# Patient Record
Sex: Female | Born: 1980 | Race: White | Hispanic: No | Smoking: Former smoker
Health system: Southern US, Community
[De-identification: ages and names within clinical notes are randomized; demographics above are authoritative.]

## PROBLEM LIST (undated history)

## (undated) DIAGNOSIS — N200 Calculus of kidney: Secondary | ICD-10-CM

## (undated) DIAGNOSIS — E0590 Thyrotoxicosis, unspecified without thyrotoxic crisis or storm: Secondary | ICD-10-CM

## (undated) DIAGNOSIS — E039 Hypothyroidism, unspecified: Secondary | ICD-10-CM

## (undated) DIAGNOSIS — F4310 Post-traumatic stress disorder, unspecified: Secondary | ICD-10-CM

## (undated) DIAGNOSIS — F4001 Agoraphobia with panic disorder: Secondary | ICD-10-CM

## (undated) DIAGNOSIS — E119 Type 2 diabetes mellitus without complications: Secondary | ICD-10-CM

## (undated) DIAGNOSIS — F411 Generalized anxiety disorder: Secondary | ICD-10-CM

## (undated) DIAGNOSIS — D497 Neoplasm of unspecified behavior of endocrine glands and other parts of nervous system: Secondary | ICD-10-CM

## (undated) HISTORY — PX: LITHOTRIPSY: SUR834

---

## 2009-05-07 ENCOUNTER — Ambulatory Visit: Payer: Self-pay | Admitting: Diagnostic Radiology

## 2009-05-07 ENCOUNTER — Emergency Department (HOSPITAL_BASED_OUTPATIENT_CLINIC_OR_DEPARTMENT_OTHER): Admission: EM | Admit: 2009-05-07 | Discharge: 2009-05-07 | Payer: Self-pay | Admitting: Emergency Medicine

## 2009-05-15 ENCOUNTER — Emergency Department (HOSPITAL_BASED_OUTPATIENT_CLINIC_OR_DEPARTMENT_OTHER): Admission: EM | Admit: 2009-05-15 | Discharge: 2009-05-15 | Payer: Self-pay | Admitting: Emergency Medicine

## 2009-05-15 ENCOUNTER — Ambulatory Visit: Payer: Self-pay | Admitting: Diagnostic Radiology

## 2010-01-10 ENCOUNTER — Emergency Department (HOSPITAL_BASED_OUTPATIENT_CLINIC_OR_DEPARTMENT_OTHER): Admission: EM | Admit: 2010-01-10 | Discharge: 2010-01-10 | Payer: Self-pay | Admitting: Emergency Medicine

## 2010-02-13 ENCOUNTER — Emergency Department (HOSPITAL_BASED_OUTPATIENT_CLINIC_OR_DEPARTMENT_OTHER)
Admission: EM | Admit: 2010-02-13 | Discharge: 2010-02-13 | Payer: Self-pay | Source: Home / Self Care | Admitting: Emergency Medicine

## 2010-03-14 ENCOUNTER — Emergency Department (HOSPITAL_BASED_OUTPATIENT_CLINIC_OR_DEPARTMENT_OTHER)
Admission: EM | Admit: 2010-03-14 | Discharge: 2010-03-15 | Payer: Self-pay | Source: Home / Self Care | Admitting: Emergency Medicine

## 2010-03-20 LAB — CULTURE, ROUTINE-ABSCESS

## 2010-04-17 ENCOUNTER — Emergency Department (HOSPITAL_BASED_OUTPATIENT_CLINIC_OR_DEPARTMENT_OTHER)
Admission: EM | Admit: 2010-04-17 | Discharge: 2010-04-17 | Disposition: A | Discharge status: Home / Self Care | Payer: Medicaid Other | Attending: Emergency Medicine | Admitting: Emergency Medicine

## 2010-04-17 DIAGNOSIS — L03317 Cellulitis of buttock: Secondary | ICD-10-CM | POA: Insufficient documentation

## 2010-04-17 DIAGNOSIS — L0231 Cutaneous abscess of buttock: Secondary | ICD-10-CM | POA: Insufficient documentation

## 2010-05-21 LAB — BASIC METABOLIC PANEL
Calcium: 9.6 mg/dL (ref 8.4–10.5)
Creatinine, Ser: 0.8 mg/dL (ref 0.4–1.2)
Glucose, Bld: 78 mg/dL (ref 70–99)
Potassium: 4.3 mEq/L (ref 3.5–5.1)
Sodium: 144 mEq/L (ref 135–145)

## 2010-05-21 LAB — URINALYSIS, ROUTINE W REFLEX MICROSCOPIC
Bilirubin Urine: NEGATIVE
Bilirubin Urine: NEGATIVE
Ketones, ur: NEGATIVE mg/dL
Nitrite: NEGATIVE
Nitrite: NEGATIVE
Protein, ur: 100 mg/dL — AB
Protein, ur: 100 mg/dL — AB
Urobilinogen, UA: 0.2 mg/dL (ref 0.0–1.0)
Urobilinogen, UA: 0.2 mg/dL (ref 0.0–1.0)

## 2010-05-21 LAB — DIFFERENTIAL
Basophils Relative: 1 % (ref 0–1)
Eosinophils Relative: 7 % — ABNORMAL HIGH (ref 0–5)
Monocytes Relative: 6 % (ref 3–12)

## 2010-05-21 LAB — URINE MICROSCOPIC-ADD ON

## 2010-05-21 LAB — CBC
HCT: 37 % (ref 36.0–46.0)
Hemoglobin: 12.6 g/dL (ref 12.0–15.0)
Platelets: 265 10*3/uL (ref 150–400)
WBC: 7.4 10*3/uL (ref 4.0–10.5)

## 2010-05-21 LAB — URINE CULTURE

## 2010-11-02 ENCOUNTER — Encounter: Payer: Self-pay | Admitting: *Deleted

## 2010-11-02 ENCOUNTER — Emergency Department (HOSPITAL_BASED_OUTPATIENT_CLINIC_OR_DEPARTMENT_OTHER)
Admission: EM | Admit: 2010-11-02 | Discharge: 2010-11-02 | Disposition: A | Discharge status: Home / Self Care | Payer: Medicaid Other | Attending: Emergency Medicine | Admitting: Emergency Medicine

## 2010-11-02 ENCOUNTER — Emergency Department (INDEPENDENT_AMBULATORY_CARE_PROVIDER_SITE_OTHER): Payer: Medicaid Other

## 2010-11-02 DIAGNOSIS — J3489 Other specified disorders of nose and nasal sinuses: Secondary | ICD-10-CM

## 2010-11-02 DIAGNOSIS — R42 Dizziness and giddiness: Secondary | ICD-10-CM

## 2010-11-02 DIAGNOSIS — N390 Urinary tract infection, site not specified: Secondary | ICD-10-CM | POA: Insufficient documentation

## 2010-11-02 DIAGNOSIS — R51 Headache: Secondary | ICD-10-CM | POA: Insufficient documentation

## 2010-11-02 LAB — BASIC METABOLIC PANEL
BUN: 13 mg/dL (ref 6–23)
CO2: 26 mEq/L (ref 19–32)
Calcium: 9.8 mg/dL (ref 8.4–10.5)
Chloride: 103 mEq/L (ref 96–112)
GFR calc Af Amer: >60 mL/min (ref >60)
GFR calc non Af Amer: >60 mL/min (ref >60)

## 2010-11-02 LAB — URINALYSIS, MICROSCOPIC ONLY
Glucose, UA: NEGATIVE mg/dL
Nitrite: NEGATIVE
Protein, ur: NEGATIVE mg/dL
Urobilinogen, UA: 0.2 mg/dL (ref 0.0–1.0)

## 2010-11-02 LAB — DIFFERENTIAL
Lymphocytes Relative: 31 % (ref 12–46)
Monocytes Absolute: 0.6 10*3/uL (ref 0.1–1.0)
Monocytes Relative: 8 % (ref 3–12)
Neutro Abs: 4 10*3/uL (ref 1.7–7.7)
Neutrophils Relative %: 56 % (ref 43–77)

## 2010-11-02 LAB — CBC
HCT: 38.2 % (ref 36.0–46.0)
MCHC: 34.6 g/dL (ref 30.0–36.0)
MCV: 89 fL (ref 78.0–100.0)
Platelets: 258 10*3/uL (ref 150–400)
RBC: 4.29 MIL/uL (ref 3.87–5.11)
RDW: 12.4 % (ref 11.5–15.5)
WBC: 7.2 10*3/uL (ref 4.0–10.5)

## 2010-11-02 MED ORDER — SODIUM CHLORIDE 0.9 % IV BOLUS (SEPSIS): 1000.0000 mL | Freq: Once | INTRAVENOUS | Status: DC

## 2010-11-02 MED ORDER — CEPHALEXIN 500 MG PO CAPS: 500.0000 mg | ORAL_CAPSULE | Freq: Four times a day (QID) | ORAL | Status: AC

## 2010-11-02 MED ORDER — CEPHALEXIN 250 MG PO CAPS
500.0000 mg | ORAL_CAPSULE | Freq: Once | ORAL | Status: AC
  Administered 2010-11-02: 500 mg via ORAL
  Filled 2010-11-02: qty 2

## 2010-11-02 NOTE — ED Notes (Signed)
Pt amb to room 4 with quick steady gait in nad.  Pt has multiple vague complaints, states she wants a physical to check on vag discharge x last July, intermittant dizzyness, states she has seen here pcp and was told "overactive thyroid", could not return due to issues with her medicaid.  Pt states "I just want everything checked out today, I want bloodwork, a ct scan, and everything looked at".

## 2010-11-02 NOTE — ED Notes (Signed)
Pt is talking and texting on cell phone, chatting with her mother throughout triage.  Pt refuses to change into gown, states "I don't want to do that right now..."  Explained that md may want to perform pelvic exam to address her discharge, pt states "I am not having that done here, I just had that at my gyn.Marland KitchenMarland Kitchen"

## 2010-11-02 NOTE — ED Provider Notes (Signed)
History     CSN: [id number] Arrival date & time: 11/02/2010  7:59 AM  Chief Complaint  Patient presents with  . Dizziness   HPI Comments: Patient has multiple complaints. She reports that she's had a diffuse headache as well as generalized fatigue and weakness for the past 2 weeks. She denies nausea or vomiting. She denies any urinary symptoms. She says she's had vaginal discharge for the past year but has had this evaluated and does not think this is an STI. She was told that this could just be a normal variant. Patient says she was recently told by [facility] that she had a slightly overactive thyroid. She is hemodynamically stable here and reports no fevers. Patient does also report that she has been lactating since the birth of her second child who is now 57 years old. She is not breast-feeding and has not been breast-feeding since April of 2010. She had seen a doctor in [place] about this who started her on some sort of medication. She has not taken it as she was told that she needed to have someone else around to watch her children when she took it. Patient reports that she is currently between primary care providers. She still has the medication that she is supposed to take for this. Patient does state that she has difficulty with her vision at times. Her vision was last evaluated here ago. She says that she is having no visual symptoms at this moment. She describes her symptoms and sometimes it being difficult to focus. She also mentions that she has floaters and she had these at the time of her last evaluation when she was negative.  Patient is a 30 y.o. female presenting with weakness. The history is provided by the patient. No language interpreter was used.  Weakness The primary symptoms include headaches. Primary symptoms do not include paresthesias, focal weakness, loss of sensation, speech change, memory loss, fever, nausea or vomiting. Primary symptoms comment: Lightheadedness. Patient  says that from time to time she has to strain harder to see things but is able to focus her vision. The symptoms began more than 1 week ago (This has been going on for 2 weeks.). The symptoms are unchanged. The neurological symptoms are diffuse.  The headache is associated with weakness. The headache is not associated with aura, photophobia, neck stiffness or paresthesias.  Additional symptoms include weakness. Additional symptoms do not include neck stiffness, pain, photophobia, aura, hallucinations, nystagmus, taste disturbance, hyperacusis, hearing loss, tinnitus or vertigo. Workup history does not include MRI or CT scan.    History reviewed. No pertinent past medical history.  History reviewed. No pertinent past surgical history.  History reviewed. No pertinent family history.  History  Substance Use Topics  . Smoking status: Not on file  . Smokeless tobacco: Not on file  . Alcohol Use: Not on file    OB History    Grav Para Term Preterm Abortions TAB SAB Ect Mult Living                  Review of Systems  Constitutional: Negative for fever.  HENT: Negative for hearing loss, neck stiffness and tinnitus.   Eyes: Positive for visual disturbance. Negative for photophobia.  Respiratory: Negative.   Cardiovascular: Negative.   Gastrointestinal: Negative.  Negative for nausea and vomiting.  Genitourinary: Negative.   Musculoskeletal: Negative.   Skin: Negative.   Neurological: Positive for weakness and headaches. Negative for vertigo, speech change, focal weakness and paresthesias.  Hematological:  Negative.   Psychiatric/Behavioral: Negative.  Negative for hallucinations and memory loss.  All other systems reviewed and are negative.    Physical Exam  BP 117/81  Pulse 63  Temp(Src) 98.2 F (36.8 C) (Oral)  Resp 18  SpO2 99%  Physical Exam  Nursing note and vitals reviewed. Constitutional: She is oriented to person, place, and time. She appears well-developed and  well-nourished. No distress.  HENT:  Head: Normocephalic and atraumatic.  Eyes: Conjunctivae and EOM are normal. Pupils are equal, round, and reactive to light.  Neck: Normal range of motion.  Cardiovascular: Normal rate, regular rhythm, normal heart sounds and intact distal pulses.  Exam reveals no gallop and no friction rub.   No murmur heard. Pulmonary/Chest: Effort normal and breath sounds normal. No respiratory distress. She has no wheezes. She has no rales.  Abdominal: Soft. Bowel sounds are normal. She exhibits no distension. There is no tenderness. There is no rebound and no guarding.  Musculoskeletal: Normal range of motion.  Neurological: She is alert and oriented to person, place, and time. No cranial nerve deficit. She exhibits normal muscle tone. Coordination normal.  Skin: Skin is warm and dry. No rash noted.  Psychiatric: She has a normal mood and affect.    ED Course  Procedures  MDM Patient was benign in appearance with stable vital signs. She reported that she had never had imaging of her head despite this finding of difficulty with lactation. I doubt this is the case but a head CT was performed today. No abnormalities were noted. Patient did have a CBC and renal performed. These were normal as well. Urinalysis did show urinary tract infection. Patient was given a dose of Keflex 500 mg by mouth here. She was given a seven-day course of this. Her general malaise may have been secondary to this. I did recommend that she followup with her primary care physician regarding the issues surrounding monitoring of her thyroid function as well as her lactation. Patient does not have any evidence of hyperthyroidism here today. She also does not have any focal neurologic signs. Patient was felt safe for discharge home and was discharged with plan in place.  Assessment: 30 year old female who presents today with multiple complaints with urinary tract infection.  Plan: Discharge home as per  MDM above.      Cyndra Numbers, MD 11/02/10 [phone]

## 2010-11-04 ENCOUNTER — Other Ambulatory Visit: Payer: Self-pay

## 2010-11-04 ENCOUNTER — Encounter (HOSPITAL_BASED_OUTPATIENT_CLINIC_OR_DEPARTMENT_OTHER): Payer: Self-pay | Admitting: *Deleted

## 2010-11-04 ENCOUNTER — Emergency Department (HOSPITAL_BASED_OUTPATIENT_CLINIC_OR_DEPARTMENT_OTHER)
Admission: EM | Admit: 2010-11-04 | Discharge: 2010-11-04 | Disposition: A | Discharge status: Home / Self Care | Payer: Medicaid Other | Attending: Emergency Medicine | Admitting: Emergency Medicine

## 2010-11-04 DIAGNOSIS — R42 Dizziness and giddiness: Secondary | ICD-10-CM | POA: Insufficient documentation

## 2010-11-04 DIAGNOSIS — R002 Palpitations: Secondary | ICD-10-CM

## 2010-11-04 DIAGNOSIS — F17200 Nicotine dependence, unspecified, uncomplicated: Secondary | ICD-10-CM | POA: Insufficient documentation

## 2010-11-04 HISTORY — DX: Calculus of kidney: N20.0

## 2010-11-04 HISTORY — DX: Thyrotoxicosis, unspecified without thyrotoxic crisis or storm: E05.90

## 2010-11-04 NOTE — ED Provider Notes (Signed)
History     CSN: [id number] Arrival date & time: 11/04/2010 11:41 AM  Chief Complaint  Patient presents with  . Dizziness   HPI Complains of palpitations with fast heartbeat 2 episodes lasting 2 minutes at each this morning symptoms resolve spontaneously without treatment denies chest pain denies shortness of breath presently asymptomatic except "I feel like I'm in a fog." Patient seen here 2 days ago diagnosed with urinary tract infection. Prescribed Keflex Past Medical History  Diagnosis Date  . Kidney stones   . Hyperthyroidism     Past Surgical History  Procedure Date  . Lithotripsy     No family history on file.  History  Substance Use Topics  . Smoking status: Current Some Day Smoker  . Smokeless tobacco: Not on file  . Alcohol Use: Yes    OB History    Grav Para Term Preterm Abortions TAB SAB Ect Mult Living                  Review of Systems  Constitutional: Negative.        Dizziness for one month(fatigue)  HENT: Negative.        Bilateral ear pain for 1 or 2 months  Respiratory: Negative.   Cardiovascular: Positive for palpitations.  Gastrointestinal: Negative.   Genitourinary:       Menses are irregular  Musculoskeletal: Negative.   Skin: Negative.   Neurological: Positive for dizziness.  Hematological: Negative.   Psychiatric/Behavioral: Negative.     Physical Exam  BP 108/70  Pulse 63  Temp(Src) 98 F (36.7 C) (Oral)  Resp 18  SpO2 100%  Physical Exam  Nursing note and vitals reviewed. Constitutional: She is oriented to person, place, and time. She appears well-developed and well-nourished.  HENT:  Head: Normocephalic and atraumatic.  Right Ear: External ear normal.  Left Ear: External ear normal.       Bilateral tympanic membranes are normal  Eyes: Conjunctivae are normal. Pupils are equal, round, and reactive to light.  Neck: Neck supple. No tracheal deviation present. No thyromegaly present.  Cardiovascular: Normal rate and regular  rhythm.   No murmur heard. Pulmonary/Chest: Effort normal and breath sounds normal.  Abdominal: Soft. Bowel sounds are normal. She exhibits no distension. There is no tenderness.  Musculoskeletal: Normal range of motion. She exhibits no edema and no tenderness.  Neurological: She is alert and oriented to person, place, and time. She has normal reflexes. Coordination normal.       Gait normal  Skin: Skin is warm and dry. No rash noted.  Psychiatric: She has a normal mood and affect.    ED Course  Procedures 11/04/2010 Date: 11/04/2010  Rate: 55  Rhythm: sinus bradycardia  QRS Axis: normal  Intervals: normal  ST/T Wave abnormalities: normal  Conduction Disutrbances:none  Narrative Interpretation:   Old EKG Reviewed: none available  MDM Patient with multiple somatic complaints. Normal exam  Lab work from last ED visit and chart reviewed. Plan followup with PMD    Doug Sou, MD 11/04/10 [id number]

## 2010-11-04 NOTE — ED Notes (Addendum)
Patient states that she was seen Friday for uti& HA, continues to have HA & dizziness. This AM she states that she has been having heart palpations & having trouble catching her breath, hot flashes.. Took ibuprofen, but no relief, last taken on Thur.

## 2011-04-08 ENCOUNTER — Emergency Department (HOSPITAL_BASED_OUTPATIENT_CLINIC_OR_DEPARTMENT_OTHER)
Admission: EM | Admit: 2011-04-08 | Discharge: 2011-04-08 | Disposition: A | Discharge status: Home / Self Care | Payer: Medicaid Other | Attending: Emergency Medicine | Admitting: Emergency Medicine

## 2011-04-08 ENCOUNTER — Encounter (HOSPITAL_BASED_OUTPATIENT_CLINIC_OR_DEPARTMENT_OTHER): Payer: Self-pay | Admitting: *Deleted

## 2011-04-08 DIAGNOSIS — N390 Urinary tract infection, site not specified: Secondary | ICD-10-CM | POA: Insufficient documentation

## 2011-04-08 DIAGNOSIS — R319 Hematuria, unspecified: Secondary | ICD-10-CM | POA: Insufficient documentation

## 2011-04-08 DIAGNOSIS — F17200 Nicotine dependence, unspecified, uncomplicated: Secondary | ICD-10-CM | POA: Insufficient documentation

## 2011-04-08 HISTORY — DX: Neoplasm of unspecified behavior of endocrine glands and other parts of nervous system: D49.7

## 2011-04-08 LAB — URINALYSIS, ROUTINE W REFLEX MICROSCOPIC
Bilirubin Urine: NEGATIVE
Glucose, UA: NEGATIVE mg/dL
Ketones, ur: 15 mg/dL — AB
Protein, ur: 100 mg/dL — AB
pH: 6 (ref 5.0–8.0)

## 2011-04-08 LAB — URINE MICROSCOPIC-ADD ON

## 2011-04-08 LAB — PREGNANCY, URINE: Preg Test, Ur: NEGATIVE

## 2011-04-08 MED ORDER — CEFPODOXIME PROXETIL 200 MG PO TABS: 200.0000 mg | ORAL_TABLET | Freq: Two times a day (BID) | ORAL | Status: AC

## 2011-04-08 MED ORDER — LIDOCAINE HCL (PF) 1 % IJ SOLN
INTRAMUSCULAR | Status: AC
  Administered 2011-04-08: 5 mL
  Filled 2011-04-08: qty 5

## 2011-04-08 MED ORDER — PHENAZOPYRIDINE HCL 200 MG PO TABS: 200.0000 mg | ORAL_TABLET | Freq: Three times a day (TID) | ORAL | Status: AC

## 2011-04-08 MED ORDER — CEFTRIAXONE SODIUM 1 G IJ SOLR
1.0000 g | Freq: Once | INTRAMUSCULAR | Status: AC
  Administered 2011-04-08: 1 g via INTRAMUSCULAR
  Filled 2011-04-08: qty 10

## 2011-04-08 NOTE — ED Notes (Signed)
Pt reports pelvic pressure painful urination and hematuria denies N/V or fever

## 2011-04-08 NOTE — ED Provider Notes (Signed)
History     CSN: [id number]  Arrival date & time 04/08/11  0002   First MD Initiated Contact with Patient 04/08/11 0102      Chief Complaint  Patient presents with  . Hematuria    (Consider location/radiation/quality/duration/timing/severity/associated sxs/prior treatment) The history is provided by the patient.   chief complaint is dysuria with hematuria. Onset yesterday. Location suprapubic region with pressure and burning sensation when she urinates. No radiation. Duration is constant and every time she urinates since onset. Moderate in severity. No fevers or chills. No nausea vomiting. No back pain. No history of same. No vaginal discharge. No abdominal pain otherwise.  Past Medical History  Diagnosis Date  . Kidney stones   . Hyperthyroidism   . Pituitary tumor     Past Surgical History  Procedure Date  . Lithotripsy     History reviewed. No pertinent family history.  History  Substance Use Topics  . Smoking status: Current Some Day Smoker  . Smokeless tobacco: Not on file  . Alcohol Use: Yes    OB History    Grav Para Term Preterm Abortions TAB SAB Ect Mult Living                  Review of Systems  Constitutional: Negative for fever and chills.  HENT: Negative for neck pain and neck stiffness.   Eyes: Negative for pain.  Respiratory: Negative for shortness of breath.   Cardiovascular: Negative for chest pain.  Gastrointestinal: Negative for abdominal pain.  Genitourinary: Positive for dysuria, urgency and frequency.  Musculoskeletal: Negative for back pain.  Skin: Negative for rash.  Neurological: Negative for headaches.  All other systems reviewed and are negative.    Allergies  Guiaphen hd  Home Medications   Current Outpatient Rx  Name Route Sig Dispense Refill  . MULTIVITAMINS PO TABS Oral Take 1 tablet by mouth daily.        BP 112/81  Pulse 90  Temp(Src) 97.7 F (36.5 C) (Oral)  Resp 16  SpO2 100%  LMP 03/11/2011  Physical  Exam  Constitutional: She is oriented to person, place, and time. She appears well-developed and well-nourished.  HENT:  Head: Normocephalic and atraumatic.  Eyes: Conjunctivae and EOM are normal. Pupils are equal, round, and reactive to light.  Neck: Full passive range of motion without pain. Neck supple. No thyromegaly present.  Cardiovascular: Normal rate, regular rhythm, S1 normal, S2 normal and intact distal pulses.   Pulmonary/Chest: Effort normal and breath sounds normal.  Abdominal: Soft. Bowel sounds are normal. There is no rebound, no guarding and no CVA tenderness.       Localizes discomfort to suprapubic region without reproducible tenderness  Musculoskeletal: Normal range of motion.  Neurological: She is alert and oriented to person, place, and time. No cranial nerve deficit or sensory deficit.  Skin: Skin is warm and dry. No rash noted. No cyanosis. Nails show no clubbing.  Psychiatric: She has a normal mood and affect. Her speech is normal and behavior is normal.    ED Course  Procedures (including critical care time)  Labs Reviewed  URINALYSIS, ROUTINE W REFLEX MICROSCOPIC - Abnormal; Notable for the following:    Color, Urine RED (*) BIOCHEMICALS MAY BE AFFECTED BY COLOR   APPearance TURBID (*)    Hgb urine dipstick LARGE (*)    Ketones, ur 15 (*)    Protein, ur 100 (*)    Leukocytes, UA LARGE (*)    All other components within normal  limits  URINE MICROSCOPIC-ADD ON - Abnormal; Notable for the following:    Squamous Epithelial / LPF FEW (*)    Bacteria, UA MANY (*)    All other components within normal limits  PREGNANCY, URINE      MDM   IM Rocephin for UTI. No clinical pyelonephritis. Stable for outpatient management of infection with prescription provided antibiotics. Reliable historian and agrees to strict return precautions.        Sunnie Nielsen, MD 04/08/11 [phone]

## 2013-05-02 LAB — METABOLIC PANEL, COMPREHENSIVE
A-G Ratio: 1 — ABNORMAL LOW (ref 1.1–2.2)
ALT (SGPT): 79 U/L — ABNORMAL HIGH (ref 12–78)
AST (SGOT): 34 U/L (ref 15–37)
Albumin: 4 g/dL (ref 3.5–5.0)
Alk. phosphatase: 75 U/L (ref 45–117)
Anion gap: 8 mmol/L (ref 5–15)
BUN/Creatinine ratio: 17 (ref 12–20)
BUN: 12 MG/DL (ref 6–20)
Bilirubin, total: 0.3 MG/DL (ref 0.2–1.0)
CO2: 25 mmol/L (ref 21–32)
Calcium: 8.8 MG/DL (ref 8.5–10.1)
Chloride: 108 mmol/L (ref 97–108)
Creatinine: 0.69 MG/DL (ref 0.45–1.15)
GFR est AA: >60 mL/min/{1.73_m2} (ref >60)
GFR est non-AA: >60 mL/min/{1.73_m2} (ref >60)
Globulin: 3.9 g/dL (ref 2.0–4.0)
Glucose: 102 mg/dL — ABNORMAL HIGH (ref 65–100)
Potassium: 3.8 mmol/L (ref 3.5–5.1)
Protein, total: 7.9 g/dL (ref 6.4–8.2)
Sodium: 141 mmol/L (ref 136–145)

## 2013-05-02 LAB — URINALYSIS W/ REFLEX CULTURE
Bilirubin: NEGATIVE
Blood: NEGATIVE
Glucose: NEGATIVE mg/dL
Nitrites: NEGATIVE
Protein: NEGATIVE mg/dL
Specific gravity: 1.021 (ref 1.003–1.030)
Urobilinogen: 0.2 EU/dL (ref 0.2–1.0)
pH (UA): 6.5 (ref 5.0–8.0)

## 2013-05-02 LAB — CBC WITH AUTOMATED DIFF
ABS. BASOPHILS: 0.1 10*3/uL (ref 0.0–0.1)
ABS. EOSINOPHILS: 0.4 10*3/uL (ref 0.0–0.4)
ABS. LYMPHOCYTES: 2.2 10*3/uL (ref 0.8–3.5)
ABS. MONOCYTES: 0.4 10*3/uL (ref 0.0–1.0)
ABS. NEUTROPHILS: 6 10*3/uL (ref 1.8–8.0)
BASOPHILS: 1 % (ref 0–1)
EOSINOPHILS: 4 % (ref 0–7)
HCT: 40.2 % (ref 35.0–47.0)
HGB: 13.2 g/dL (ref 11.5–16.0)
LYMPHOCYTES: 25 % (ref 12–49)
MCH: 29.3 PG (ref 26.0–34.0)
MCHC: 32.8 g/dL (ref 30.0–36.5)
MCV: 89.3 FL (ref 80.0–99.0)
MONOCYTES: 5 % (ref 5–13)
NEUTROPHILS: 65 % (ref 32–75)
PLATELET: 272 10*3/uL (ref 150–400)
RBC: 4.5 M/uL (ref 3.80–5.20)
RDW: 13.1 % (ref 11.5–14.5)
WBC: 9.1 10*3/uL (ref 3.6–11.0)

## 2013-05-02 LAB — HCG URINE, QL: HCG urine, QL: NEGATIVE

## 2013-05-02 LAB — CK W/ CKMB & INDEX
CK - MB: 0.5 NG/ML (ref 0.5–3.6)
CK-MB Index: 1 (ref 0–2.5)
CK: 52 U/L (ref 26–192)

## 2013-05-02 LAB — TROPONIN I: Troponin-I, Qt.: <0.04 ng/mL (ref <0.05)

## 2013-05-02 MED ORDER — CEPHALEXIN 500 MG CAP
500 mg | ORAL_CAPSULE | Freq: Four times a day (QID) | ORAL | Status: AC
Start: 2013-05-02 — End: 2013-05-09

## 2013-05-02 NOTE — ED Notes (Addendum)
Pt arrived via wheelchair to room #27 from triage.  Pt with c/o of intermittent "tachycardia" and spells of dizziness and lightheadedness x 1 month.  Pt denies seeking medical attention x onset of sx's.  Pt denies N/V/D, diaphoresis, or SOB with onset of sx's.  Pt notes that she has a hx of thyroid issues, for which she has been worked up for.  Pt is highly anxious upon initial assessment.  Pt resting in position of comfort. Call bell within reach.

## 2013-05-02 NOTE — ED Notes (Signed)
Called to the front desk, patient is requesting to leave. Charge nurse and physician have been notified. Pt gave urine sample and had chest xray but does not want to wait any longer to be seen. States that [place] and needs to get on the road. Pt is requesting what results we have obtained thus far.

## 2013-05-02 NOTE — ED Provider Notes (Signed)
HPI Comments: Desiree Solis is a 33 y.o. female presenting via wheelchair to ED c/o intermittent palpitations and dizziness x 1 month. Pt reports associated chest tightness, CP, SOB, and nausea with the episodes. Pt describes SOB "I'm struggling to breathe but my pulse ox is ok." Pt states she has taken her BP during the episodes and it has been normal. Pt notes her legs intermittently feel "like I did a leg workout at the gym and they are struggling to keep up." Pt states she believed the episodes to be panic attacks, but they have not subsided. Pt reports hx of heartburn and states she has taken Tums, without relief. Pt notes her vision is blurry, but states she has set up an eye exam. Pt denies having a PCP for follow up due to just moving to [place]. Pt states she has a pituitary tumor and thyroid problems, noting she has lost weight but not changed her diet. Pt denies any vomiting, diarrhea, F/C, or appetite changes.       PMHx Significant For: pituitary tumor, thyroid problems  PSHx Significant For: Pt denies  Social Hx: - tobacco, - EtOH, - illicit drugs    There are no other complaints, changes or physical findings at this time.     Written by Rickey Barbara, ED Scribe; as dictated by Katha Cabal, MD.     The history is provided by the patient.        No past medical history on file.     No past surgical history on file.      No family history on file.     History     Social History   ??? Marital Status: DIVORCED     Spouse Name: N/A     Number of Children: N/A   ??? Years of Education: N/A     Occupational History   ??? Not on file.     Social History Main Topics   ??? Smoking status: Not on file   ??? Smokeless tobacco: Not on file   ??? Alcohol Use: Not on file   ??? Drug Use: Not on file   ??? Sexual Activity: Not on file     Other Topics Concern   ??? Not on file     Social History Narrative   ??? No narrative on file                  ALLERGIES: Amoxicillin      Review of Systems   Constitutional: Negative.   Negative for fever, chills, activity change, appetite change, fatigue and unexpected weight change.   HENT: Negative.  Negative for congestion, hearing loss, rhinorrhea, sneezing and voice change.    Eyes: Positive for visual disturbance. Negative for pain. Photophobia: blurry See HPI.   Respiratory: Positive for chest tightness and shortness of breath. Negative for apnea, cough and choking.    Cardiovascular: Positive for chest pain and palpitations.   Gastrointestinal: Positive for nausea. Negative for vomiting, abdominal pain, diarrhea, blood in stool and abdominal distention.   Genitourinary: Negative.  Negative for urgency, frequency, flank pain and difficulty urinating.   Musculoskeletal: Negative.  Negative for myalgias, back pain, arthralgias and neck stiffness.   Skin: Negative.  Negative for color change and rash.   Neurological: Positive for dizziness. Negative for seizures, syncope, speech difficulty, weakness, numbness and headaches.   Hematological: Negative for adenopathy.   Psychiatric/Behavioral: Negative.  Negative for suicidal ideas, behavioral problems, dysphoric mood and agitation. The patient  is not nervous/anxious.    All other systems reviewed and are negative.      Filed Vitals:    05/02/13 1248   BP: 142/83   Pulse: 96   Temp: 97.5 ??F (36.4 ??C)   Resp: 16   Height: 5' 5" (1.651 m)   Weight: 67.2 kg (148 lb 2.4 oz)   SpO2: 96%            Physical Exam   Constitutional: She is oriented to person, place, and time. Vital signs are normal. She appears well-developed and well-nourished. She does not appear ill. No distress.   HENT:   Head: Normocephalic.   Mouth/Throat: Mucous membranes are normal.   Eyes: Conjunctivae are normal.   Neck: Neck supple. No JVD present. No thyromegaly present.   Cardiovascular: Normal rate and regular rhythm.    Pulmonary/Chest: Effort normal and breath sounds normal. No accessory muscle usage. No respiratory distress.   Abdominal: Soft. There is no tenderness.    Lymphadenopathy:     She has no cervical adenopathy.   Neurological: She is alert and oriented to person, place, and time.   Skin: Skin is warm and dry.   Psychiatric: Her speech is rapid and/or pressured.        MDM  Number of Diagnoses or Management Options  Diagnosis management comments: DDx: anxiety, palpitations, electrolyte abnormality, hyperthyroid        Amount and/or Complexity of Data Reviewed  Clinical lab tests: ordered and reviewed  Tests in the radiology section of CPT??: ordered and reviewed  Tests in the medicine section of CPT??: ordered and reviewed  Review and summarize past medical records: yes  Independent visualization of images, tracings, or specimens: yes        Procedures    EKG interpretation: (Preliminary) 1246  Rhythm: normal sinus rhythm; and regular . Rate (approx.): 69; Axis: normal; P wave: normal; QRS interval: normal ; ST/T wave: normal; Other findings: normal.  Written by Rickey Barbara, ED Scribe, as dictated by Alex Gardener, MD.      LABORATORY TESTS:  Recent Results (from the past 12 hour(s))   EKG, 12 LEAD, INITIAL    Collection Time     05/02/13 12:46 PM       Result Value Ref Range    Ventricular Rate 69      Atrial Rate 69      P-R Interval 136      QRS Duration 80      Q-T Interval 404      QTC Calculation (Bezet) 432      Calculated P Axis 33      Calculated R Axis 49      Calculated T Axis 32      Diagnosis        Value: Normal sinus rhythm  ST abnormality, possible digitalis effect  No previous ECGs available     URINALYSIS W/ REFLEX CULTURE    Collection Time     05/02/13  1:19 PM       Result Value Ref Range    Color YELLOW/STRAW      Appearance CLOUDY (*) CLEAR      Specific gravity 1.021  1.003 - 1.030      pH (UA) 6.5  5.0 - 8.0      Protein NEGATIVE   NEG mg/dL    Glucose NEGATIVE   NEG mg/dL    Ketone TRACE (*) NEG mg/dL    Bilirubin NEGATIVE   NEG  Blood NEGATIVE   NEG      Urobilinogen 0.2  0.2 - 1.0 EU/dL    Nitrites NEGATIVE   NEG      Leukocyte Esterase  LARGE (*) NEG      WBC 50-100  0 - 4 /hpf    RBC 5-10  0 - 5 /hpf    Epithelial cells MODERATE (*) FEW /lpf    Bacteria 2+ (*) NEG /hpf    UA:UC IF INDICATED URINE CULTURE ORDERED (*) CNI     HCG URINE, QL    Collection Time     05/02/13  1:19 PM       Result Value Ref Range    HCG urine, Ql. NEGATIVE   NEG     CBC WITH AUTOMATED DIFF    Collection Time     05/02/13  3:35 PM       Result Value Ref Range    WBC 9.1  3.6 - 11.0 K/uL    RBC 4.50  3.80 - 5.20 M/uL    HGB 13.2  11.5 - 16.0 g/dL    HCT 40.2  35.0 - 47.0 %    MCV 89.3  80.0 - 99.0 FL    MCH 29.3  26.0 - 34.0 PG    MCHC 32.8  30.0 - 36.5 g/dL    RDW 13.1  11.5 - 14.5 %    PLATELET 272  150 - 400 K/uL    NEUTROPHILS 65  32 - 75 %    LYMPHOCYTES 25  12 - 49 %    MONOCYTES 5  5 - 13 %    EOSINOPHILS 4  0 - 7 %    BASOPHILS 1  0 - 1 %    ABS. NEUTROPHILS 6.0  1.8 - 8.0 K/UL    ABS. LYMPHOCYTES 2.2  0.8 - 3.5 K/UL    ABS. MONOCYTES 0.4  0.0 - 1.0 K/UL    ABS. EOSINOPHILS 0.4  0.0 - 0.4 K/UL    ABS. BASOPHILS 0.1  0.0 - 0.1 K/UL   METABOLIC PANEL, COMPREHENSIVE    Collection Time     05/02/13  3:35 PM       Result Value Ref Range    Sodium 141  136 - 145 mmol/L    Potassium 3.8  3.5 - 5.1 mmol/L    Chloride 108  97 - 108 mmol/L    CO2 25  21 - 32 mmol/L    Anion gap 8  5 - 15 mmol/L    Glucose 102 (*) 65 - 100 mg/dL    BUN 12  6 - 20 MG/DL    Creatinine 0.69  0.45 - 1.15 MG/DL    BUN/Creatinine ratio 17  12 - 20      GFR est AA >60  >60 ml/min/1.84m    GFR est non-AA >60  >60 ml/min/1.714m   Calcium 8.8  8.5 - 10.1 MG/DL    Bilirubin, total 0.3  0.2 - 1.0 MG/DL    ALT 79 (*) 12 - 78 U/L    AST 34  15 - 37 U/L    Alk. phosphatase 75  45 - 117 U/L    Protein, total 7.9  6.4 - 8.2 g/dL    Albumin 4.0  3.5 - 5.0 g/dL    Globulin 3.9  2.0 - 4.0 g/dL    A-G Ratio 1.0 (*) 1.1 - 2.2     TROPONIN I    Collection Time  05/02/13  3:35 PM       Result Value Ref Range    Troponin-I, Qt. <0.04  <0.05 ng/mL   CK W/ CKMB & INDEX    Collection Time     05/02/13  3:35 PM        Result Value Ref Range    CK 52  26 - 192 U/L    CK - MB 0.5  0.5 - 3.6 NG/ML    CK-MB Index 1.0  0 - 2.5         IMAGING RESULTS:      XR CHEST PA LAT (Final result) Result time: 05/02/13 13:46:24    Final result by Rad Results In Edi (05/02/13 13:46:24)    Narrative:    **Final Report**      ICD Codes / Adm.Diagnosis: 294765 / Palpitations Dizziness, shaking,   chest pain  Examination: CR CHEST PA AND LATERAL - [id number] - May 02 2013 1:39PM  Accession No: [id number]  Reason: chest pain      REPORT:  Clinical indication: Chest pain.    Frontal and lateral views of the chest obtained. The heart size is normal.   There is no acute infiltrate or shift.      IMPRESSION: No acute changes.          Signing/Reading Doctor: Lannette Donath [phone])   Approved: Lannette Donath [id number]) May 02 2013 1:44PM            IMPRESSION:  1. Palpitations    2. UTI (urinary tract infection)        PLAN:  1. Rx Keflex  2. F/u with PCP and Cardiology in [facility]; suggest Holter and thyroid function testing.  3. Advised to refrain from caffeine, alcohol, and smoking.  Stay well hydrated.  Return to ED if worse     Discharge Note  4:24 PM    Desiree Solis is ready for discharge. The pt's signs/symptoms, results, diagnosis and discharge instructions have been discussed w/ the pt and/or available family members. The pt conveys understanding and agreement with the diagnosis and plan. There are no new physical findings, changes or complaints at this time. Pt is recommended to f/u with PCP and Cardiology or return to ED if condition worsens.   Written by Rickey Barbara, ED Scribe, as dictated by Alex Gardener, MD.

## 2013-05-02 NOTE — ED Notes (Signed)
Wilford I Mills, MD provided pt with verbal and written discharge instructions.

## 2013-05-03 LAB — EKG, 12 LEAD, INITIAL
Atrial Rate: 69 {beats}/min
Calculated P Axis: 33 degrees
Calculated R Axis: 49 degrees
Calculated T Axis: 32 degrees
P-R Interval: 136 ms
Q-T Interval: 404 ms
QRS Duration: 80 ms
QTC Calculation (Bezet): 432 ms
Ventricular Rate: 69 {beats}/min

## 2013-05-04 LAB — CULTURE, URINE
Colonies Counted: >100000
Colony Count: >100000

## 2015-03-09 ENCOUNTER — Encounter (HOSPITAL_BASED_OUTPATIENT_CLINIC_OR_DEPARTMENT_OTHER): Payer: Self-pay | Admitting: *Deleted

## 2015-03-09 ENCOUNTER — Emergency Department (HOSPITAL_BASED_OUTPATIENT_CLINIC_OR_DEPARTMENT_OTHER)
Admission: EM | Admit: 2015-03-09 | Discharge: 2015-03-09 | Disposition: A | Discharge status: Home / Self Care | Payer: Medicaid Other | Attending: Emergency Medicine | Admitting: Emergency Medicine

## 2015-03-09 DIAGNOSIS — F419 Anxiety disorder, unspecified: Secondary | ICD-10-CM

## 2015-03-09 DIAGNOSIS — Z3202 Encounter for pregnancy test, result negative: Secondary | ICD-10-CM | POA: Diagnosis not present

## 2015-03-09 DIAGNOSIS — Z8639 Personal history of other endocrine, nutritional and metabolic disease: Secondary | ICD-10-CM | POA: Diagnosis not present

## 2015-03-09 DIAGNOSIS — Z87891 Personal history of nicotine dependence: Secondary | ICD-10-CM | POA: Insufficient documentation

## 2015-03-09 DIAGNOSIS — Z8632 Personal history of gestational diabetes: Secondary | ICD-10-CM | POA: Insufficient documentation

## 2015-03-09 DIAGNOSIS — Z86018 Personal history of other benign neoplasm: Secondary | ICD-10-CM | POA: Diagnosis not present

## 2015-03-09 DIAGNOSIS — Z79899 Other long term (current) drug therapy: Secondary | ICD-10-CM | POA: Diagnosis not present

## 2015-03-09 DIAGNOSIS — Z880 Allergy status to penicillin: Secondary | ICD-10-CM | POA: Diagnosis not present

## 2015-03-09 DIAGNOSIS — R42 Dizziness and giddiness: Secondary | ICD-10-CM | POA: Diagnosis not present

## 2015-03-09 DIAGNOSIS — Z87442 Personal history of urinary calculi: Secondary | ICD-10-CM | POA: Diagnosis not present

## 2015-03-09 HISTORY — DX: Type 2 diabetes mellitus without complications: E11.9

## 2015-03-09 LAB — CBC WITH DIFFERENTIAL/PLATELET
BASOS PCT: 1 %
Basophils Absolute: 0.1 10*3/uL (ref 0.0–0.1)
Eosinophils Absolute: 0.7 10*3/uL (ref 0.0–0.7)
Eosinophils Relative: 11 %
HEMATOCRIT: 37.7 % (ref 36.0–46.0)
HEMOGLOBIN: 12.2 g/dL (ref 12.0–15.0)
LYMPHS ABS: 2.2 10*3/uL (ref 0.7–4.0)
LYMPHS PCT: 33 %
MCH: 29.5 pg (ref 26.0–34.0)
MCHC: 32.4 g/dL (ref 30.0–36.0)
MCV: 91.1 fL (ref 78.0–100.0)
MONOS PCT: 8 %
Monocytes Absolute: 0.5 10*3/uL (ref 0.1–1.0)
NEUTROS ABS: 3.2 10*3/uL (ref 1.7–7.7)
NEUTROS PCT: 47 %
Platelets: 304 10*3/uL (ref 150–400)
RBC: 4.14 MIL/uL (ref 3.87–5.11)
RDW: 12.1 % (ref 11.5–15.5)
WBC: 6.8 10*3/uL (ref 4.0–10.5)

## 2015-03-09 LAB — URINALYSIS, ROUTINE W REFLEX MICROSCOPIC
BILIRUBIN URINE: NEGATIVE
GLUCOSE, UA: NEGATIVE mg/dL
HGB URINE DIPSTICK: NEGATIVE
KETONES UR: NEGATIVE mg/dL
Nitrite: NEGATIVE
PROTEIN: NEGATIVE mg/dL
Specific Gravity, Urine: 1.008 (ref 1.005–1.030)
pH: 7.5 (ref 5.0–8.0)

## 2015-03-09 LAB — COMPREHENSIVE METABOLIC PANEL
ALBUMIN: 4.5 g/dL (ref 3.5–5.0)
ALK PHOS: 65 U/L (ref 38–126)
ALT: 33 U/L (ref 14–54)
ANION GAP: 6 (ref 5–15)
AST: 30 U/L (ref 15–41)
BILIRUBIN TOTAL: 0.4 mg/dL (ref 0.3–1.2)
BUN: 10 mg/dL (ref 6–20)
CALCIUM: 9.3 mg/dL (ref 8.9–10.3)
CO2: 27 mmol/L (ref 22–32)
Chloride: 104 mmol/L (ref 101–111)
Creatinine, Ser: 0.62 mg/dL (ref 0.44–1.00)
GFR calc Af Amer: >60 mL/min (ref >60)
GFR calc non Af Amer: >60 mL/min (ref >60)
GLUCOSE: 83 mg/dL (ref 65–99)
Potassium: 3.7 mmol/L (ref 3.5–5.1)
Sodium: 137 mmol/L (ref 135–145)
TOTAL PROTEIN: 7.9 g/dL (ref 6.5–8.1)

## 2015-03-09 LAB — URINE MICROSCOPIC-ADD ON

## 2015-03-09 LAB — CORTISOL: Cortisol, Plasma: 4.4 ug/dL

## 2015-03-09 LAB — PREGNANCY, URINE: Preg Test, Ur: NEGATIVE

## 2015-03-09 LAB — TSH: TSH: 4.94 u[IU]/mL — ABNORMAL HIGH (ref 0.350–4.500)

## 2015-03-09 MED ORDER — HYDROXYZINE HCL 25 MG PO TABS: 25.0000 mg | ORAL_TABLET | Freq: Four times a day (QID) | ORAL | Status: DC

## 2015-03-09 MED FILL — hydrOXYzine HCL 25 MG TABS: 25 | 3 days supply | Qty: 12 | Fill #0

## 2015-03-09 NOTE — Discharge Instructions (Signed)
You may be hyperthyroid. The labs for this are pending. Your doctor can give you results at your next visit.  Use hydroxyzine as needed for anxiety or panic attacks. Dizziness Dizziness is a common problem. It makes you feel unsteady or lightheaded. You may feel like you are about to pass out (faint). Dizziness can lead to injury if you stumble or fall. Anyone can get dizzy, but dizziness is more common in older adults. This condition can be caused by a number of things, including:  Medicines.  Dehydration.  Illness. HOME CARE Following these instructions may help with your condition: Eating and Drinking  Drink enough fluid to keep your pee (urine) clear or pale yellow. This helps to keep you from getting dehydrated. Try to drink more clear fluids, such as water.  Do not drink alcohol.  Limit how much caffeine you drink or eat if told by your doctor.  Limit how much salt you drink or eat if told by your doctor. Activity  Avoid making quick movements.  When you stand up from sitting in a chair, steady yourself until you feel okay.  In the morning, first sit up on the side of the bed. When you feel okay, stand slowly while you hold onto something. Do this until you know that your balance is fine.  Move your legs often if you need to stand in one place for a long time. Tighten and relax your muscles in your legs while you are standing.  Do not drive or use heavy machinery if you feel dizzy.  Avoid bending down if you feel dizzy. Place items in your home so that they are easy for you to reach without leaning over. Lifestyle  Do not use any tobacco products, including cigarettes, chewing tobacco, or electronic cigarettes. If you need help quitting, ask your doctor.  Try to lower your stress level, such as with yoga or meditation. Talk with your doctor if you need help. General Instructions  Watch your dizziness for any changes.  Take medicines only as told by your doctor. Talk  with your doctor if you think that your dizziness is caused by a medicine that you are taking.  Tell a friend or a family member that you are feeling dizzy. If he or she notices any changes in your behavior, have this person call your doctor.  Keep all follow-up visits as told by your doctor. This is important. GET HELP IF:  Your dizziness does not go away.  Your dizziness or light-headedness gets worse.  You feel sick to your stomach (nauseous).  You have trouble hearing.  You have new symptoms.  You are unsteady on your feet or you feel like the room is spinning. GET HELP RIGHT AWAY IF:  You throw up (vomit) or have diarrhea and are unable to eat or drink anything.  You have trouble:  Talking.  Walking.  Swallowing.  Using your arms, hands, or legs.  You feel generally weak.  You are not thinking clearly or you have trouble forming sentences. It may take a friend or family member to notice this.  You have:  Chest pain.  Pain in your belly (abdomen).  Shortness of breath.  Sweating.  Your vision changes.  You are bleeding.  You have a headache.  You have neck pain or a stiff neck.  You have a fever.   This information is not intended to replace advice given to you by your health care provider. Make sure you discuss any questions  you have with your health care provider.   Document Released: 01/31/2011 Document Revised: 06/28/2014 Document Reviewed: 02/07/2014 Elsevier Interactive Patient Education 2016 Elsevier Inc.  Generalized Anxiety Disorder Generalized anxiety disorder (GAD) is a mental disorder. It interferes with life functions, including relationships, work, and school. GAD is different from normal anxiety, which everyone experiences at some point in their lives in response to specific life events and activities. Normal anxiety actually helps [place] prepare for and get through these life events and activities. Normal anxiety goes away after the  event or activity is over.  GAD causes anxiety that is not necessarily related to specific events or activities. It also causes excess anxiety in proportion to specific events or activities. The anxiety associated with GAD is also difficult to control. GAD can vary from mild to severe. People with severe GAD can have intense waves of anxiety with physical symptoms (panic attacks).  SYMPTOMS The anxiety and worry associated with GAD are difficult to control. This anxiety and worry are related to many life events and activities and also occur more days than not for 6 months or longer. People with GAD also have three or more of the following symptoms (one or more in children):  Restlessness.   Fatigue.  Difficulty concentrating.   Irritability.  Muscle tension.  Difficulty sleeping or unsatisfying sleep. DIAGNOSIS GAD is diagnosed through an assessment by your health care provider. Your health care provider will ask you questions aboutyour mood,physical symptoms, and events in your life. Your health care provider may ask you about your medical history and use of alcohol or drugs, including prescription medicines. Your health care provider may also do a physical exam and blood tests. Certain medical conditions and the use of certain substances can cause symptoms similar to those associated with GAD. Your health care provider may refer you to a mental health specialist for further evaluation. TREATMENT The following therapies are usually used to treat GAD:   Medication. Antidepressant medication usually is prescribed for long-term daily control. Antianxiety medicines may be added in severe cases, especially when panic attacks occur.   Talk therapy (psychotherapy). Certain types of talk therapy can be helpful in treating GAD by providing support, education, and guidance. A form of talk therapy called cognitive behavioral therapy can teach you healthy ways to think about and react to daily life  events and activities.  Stress managementtechniques. These include yoga, meditation, and exercise and can be very helpful when they are practiced regularly. A mental health specialist can help determine which treatment is best for you. Some people see improvement with one therapy. However, other people require a combination of therapies.   This information is not intended to replace advice given to you by your health care provider. Make sure you discuss any questions you have with your health care provider.   Document Released: 06/08/2012 Document Revised: 03/04/2014 Document Reviewed: 06/08/2012 Elsevier Interactive Patient Education 2016 Elsevier Inc.  Panic Attacks Panic attacks are sudden, short feelings of great fear or discomfort. You may have them for no reason when you are relaxed, when you are uneasy (anxious), or when you are sleeping.  HOME CARE  Take all your medicines as told.  Check with your doctor before starting new medicines.  Keep all doctor visits. GET HELP IF:  You are not able to take your medicines as told.  Your symptoms do not get better.  Your symptoms get worse. GET HELP RIGHT AWAY IF:  Your attacks seem different than your normal  attacks.  You have thoughts about hurting yourself or others.  You take panic attack medicine and you have a side effect. MAKE SURE YOU:  Understand these instructions.  Will watch your condition.  Will get help right away if you are not doing well or get worse.   This information is not intended to replace advice given to you by your health care provider. Make sure you discuss any questions you have with your health care provider.   Document Released: 03/16/2010 Document Revised: 12/02/2012 Document Reviewed: 09/25/2012 Elsevier Interactive Patient Education [employer].

## 2015-03-09 NOTE — ED Notes (Signed)
Patient states she has a one year history of dizziness and has not been seen due to her insurance.  States she has a one year history  of generalized fatigue, with worsening fatigue over the last two or three months which is associated with intermittent sob. History of pituitary tumor, has a follow up appointment in two weeks at [facility]. Patient also has anxiety.  Patient states she is here today for generalized body pain and fatigue.

## 2015-03-09 NOTE — ED Provider Notes (Signed)
CSN: [id number]     Arrival date & time 03/09/15  1019 History   First MD Initiated Contact with Patient 03/09/15 1056     Chief Complaint  Patient presents with  . Dizziness     HPI  Recent presents for evaluation of multiple symptoms. States she has a history of anxiety pituitary tumor hyperthyroidism. Also fatigue palpitations. Has not been evaluated in over a year because of insurance issues. States years ago she was diagnosed with a pituitary tumor. This was found during evaluation for hyperthyroidism. Hyperthyroidism was discovered as an evaluation for lactation and palpitations. She states that she was never medicated for hyperthyroidism (?), Or diagnosed as being pan hypopituitary.  Studies facial palpitations. Occasional anxiety. That she's had more diarrhea recently. Her left breast is began lactating but is not painful. Does not feel depressed. He is sleeping well. Her appetite is good. Her weight is been down 7 pounds, however she states she has been exercising and eating better.  Past Medical History  Diagnosis Date  . Kidney stones   . Hyperthyroidism   . Pituitary tumor ([place])   . Diabetes mellitus without complication (HCC)     gestational diabetes.   Past Surgical History  Procedure Laterality Date  . Lithotripsy     History reviewed. No pertinent family history. Social History  Substance Use Topics  . Smoking status: Former Smoker    Quit date: 02/26/2011  . Smokeless tobacco: None  . Alcohol Use: Yes     Comment: occassional    OB History    No data available     Review of Systems  Constitutional: Positive for fatigue. Negative for fever, chills, diaphoresis and appetite change.  HENT: Negative for mouth sores, sore throat and trouble swallowing.   Eyes: Negative for visual disturbance.  Respiratory: Negative for cough, chest tightness, shortness of breath and wheezing.   Cardiovascular: Positive for palpitations. Negative for chest pain.   Gastrointestinal: Negative for nausea, vomiting, abdominal pain, diarrhea and abdominal distention.  Endocrine: Negative for cold intolerance, heat intolerance, polydipsia, polyphagia and polyuria.       Left breast lactation.  Genitourinary: Negative for dysuria, frequency and hematuria.  Musculoskeletal: Negative for gait problem.  Skin: Negative for color change, pallor and rash.  Neurological: Negative for dizziness, syncope, light-headedness and headaches.  Hematological: Does not bruise/bleed easily.  Psychiatric/Behavioral: Negative for behavioral problems and confusion. The patient is nervous/anxious.       Allergies  Guiaphen hd and Amoxicillin  Home Medications   Prior to Admission medications   Medication Sig Start Date End Date Taking? Authorizing Provider  hydrOXYzine (ATARAX/VISTARIL) 25 MG tablet Take 1 tablet (25 mg total) by mouth every 6 (six) hours. 03/09/15   Tanna Furry, MD  multivitamin [facility]) per tablet Take 1 tablet by mouth daily.      Historical Provider, MD   BP 112/77 mmHg  Pulse 71  Temp(Src) 98.1 F (36.7 C) (Oral)  Resp 17  Ht 5\' 5"  (1.651 m)  Wt 154 lb (69.854 kg)  BMI 25.63 kg/m2  SpO2 100%  LMP 02/22/2014 Physical Exam  Constitutional: She is oriented to person, place, and time. She appears well-developed and well-nourished. No distress.  HENT:  Head: Normocephalic.  Eyes: Conjunctivae are normal. Pupils are equal, round, and reactive to light. No scleral icterus.  Neck: Normal range of motion. Neck supple. No thyromegaly present.  Cardiovascular: Normal rate and regular rhythm.  Exam reveals no gallop and no friction rub.   No  murmur heard. Pulmonary/Chest: Effort normal and breath sounds normal. No respiratory distress. She has no wheezes. She has no rales.  Abdominal: Soft. Bowel sounds are normal. She exhibits no distension. There is no tenderness. There is no rebound.  Musculoskeletal: Normal range of motion.  Neurological: She  is alert and oriented to person, place, and time.  Skin: Skin is warm and dry. No rash noted.  Psychiatric: She has a normal mood and affect. Her behavior is normal.   she is not tachycardic. Thyroid not enlarged or palpable. No lid lag. No proptosis. Not tachycardic. No PVCs. No edema. No hyperactive reflexes.  ED Course  Procedures (including critical care time) Labs Review Labs Reviewed  URINALYSIS, ROUTINE W REFLEX MICROSCOPIC (NOT AT [facility]) - Abnormal; Notable for the following:    Leukocytes, UA TRACE (*)    All other components within normal limits  URINE MICROSCOPIC-ADD ON - Abnormal; Notable for the following:    Squamous Epithelial / LPF 0-5 (*)    Bacteria, UA FEW (*)    All other components within normal limits  TSH - Abnormal; Notable for the following:    TSH 4.940 (*)    All other components within normal limits  PREGNANCY, URINE  CBC WITH DIFFERENTIAL/PLATELET  COMPREHENSIVE METABOLIC PANEL  CORTISOL  PROLACTIN    Imaging Review No results found. I have personally reviewed and evaluated these images and lab results as part of my medical decision-making.   EKG Interpretation None      MDM   Final diagnoses:  Dizziness  Anxiety    No findings today to fully explain patient's symptoms. Symptoms can be explained with anxiety. However, this would not [place] lactation, , explain lactationwhich was a prior symptoms when she was hyperthyroid. Prolactin, TSH, cortisol are pending. She has an intake appointment in less than 2 weeks. I think this is appropriate. Offered hydroxyzine for her symptoms in the meanwhile.    Tanna Furry, MD 03/09/15 [phone]

## 2015-03-10 LAB — PROLACTIN: Prolactin: 15.6 ng/mL (ref 4.8–23.3)

## 2015-10-12 ENCOUNTER — Encounter (HOSPITAL_BASED_OUTPATIENT_CLINIC_OR_DEPARTMENT_OTHER): Payer: Self-pay | Admitting: *Deleted

## 2015-10-12 DIAGNOSIS — Z5321 Procedure and treatment not carried out due to patient leaving prior to being seen by health care provider: Secondary | ICD-10-CM | POA: Insufficient documentation

## 2015-10-12 DIAGNOSIS — R109 Unspecified abdominal pain: Secondary | ICD-10-CM | POA: Diagnosis not present

## 2015-10-12 DIAGNOSIS — E039 Hypothyroidism, unspecified: Secondary | ICD-10-CM | POA: Diagnosis not present

## 2015-10-12 DIAGNOSIS — E119 Type 2 diabetes mellitus without complications: Secondary | ICD-10-CM

## 2015-10-12 DIAGNOSIS — R42 Dizziness and giddiness: Secondary | ICD-10-CM | POA: Insufficient documentation

## 2015-10-12 DIAGNOSIS — Z87891 Personal history of nicotine dependence: Secondary | ICD-10-CM

## 2015-10-12 LAB — URINALYSIS, ROUTINE W REFLEX MICROSCOPIC
BILIRUBIN URINE: NEGATIVE
GLUCOSE, UA: NEGATIVE mg/dL
KETONES UR: NEGATIVE mg/dL
Nitrite: NEGATIVE
PH: 5.5 (ref 5.0–8.0)
Protein, ur: NEGATIVE mg/dL
Specific Gravity, Urine: 1.024 (ref 1.005–1.030)

## 2015-10-12 LAB — URINE MICROSCOPIC-ADD ON

## 2015-10-12 LAB — PREGNANCY, URINE: Preg Test, Ur: NEGATIVE

## 2015-10-12 NOTE — ED Triage Notes (Signed)
Pt c/o dizziness x 2 months with right ear pain also c/o right flank pain radiates to right shoulder

## 2015-10-13 ENCOUNTER — Encounter (HOSPITAL_BASED_OUTPATIENT_CLINIC_OR_DEPARTMENT_OTHER): Payer: Self-pay | Admitting: Emergency Medicine

## 2015-10-13 ENCOUNTER — Emergency Department (HOSPITAL_BASED_OUTPATIENT_CLINIC_OR_DEPARTMENT_OTHER)
Admission: EM | Admit: 2015-10-13 | Discharge: 2015-10-13 | Disposition: A | Discharge status: Home / Self Care | Payer: Medicaid Other | Attending: Emergency Medicine | Admitting: Emergency Medicine

## 2015-10-13 ENCOUNTER — Emergency Department (HOSPITAL_BASED_OUTPATIENT_CLINIC_OR_DEPARTMENT_OTHER)
Admission: EM | Admit: 2015-10-13 | Discharge: 2015-10-13 | Disposition: A | Discharge status: Home / Self Care | Payer: Medicaid Other | Source: Home / Self Care

## 2015-10-13 ENCOUNTER — Emergency Department (HOSPITAL_BASED_OUTPATIENT_CLINIC_OR_DEPARTMENT_OTHER): Payer: Medicaid Other

## 2015-10-13 ENCOUNTER — Encounter (HOSPITAL_BASED_OUTPATIENT_CLINIC_OR_DEPARTMENT_OTHER): Payer: Self-pay

## 2015-10-13 DIAGNOSIS — E039 Hypothyroidism, unspecified: Secondary | ICD-10-CM | POA: Insufficient documentation

## 2015-10-13 DIAGNOSIS — R109 Unspecified abdominal pain: Secondary | ICD-10-CM | POA: Insufficient documentation

## 2015-10-13 DIAGNOSIS — Z87891 Personal history of nicotine dependence: Secondary | ICD-10-CM | POA: Insufficient documentation

## 2015-10-13 DIAGNOSIS — I951 Orthostatic hypotension: Secondary | ICD-10-CM

## 2015-10-13 DIAGNOSIS — E119 Type 2 diabetes mellitus without complications: Secondary | ICD-10-CM | POA: Insufficient documentation

## 2015-10-13 HISTORY — DX: Hypothyroidism, unspecified: E03.9

## 2015-10-13 HISTORY — DX: Generalized anxiety disorder: F41.1

## 2015-10-13 HISTORY — DX: Agoraphobia with panic disorder: F40.01

## 2015-10-13 HISTORY — DX: Post-traumatic stress disorder, unspecified: F43.10

## 2015-10-13 MED ORDER — CIPROFLOXACIN HCL 500 MG PO TABS: 500.0000 mg | ORAL_TABLET | Freq: Once | ORAL | Status: DC

## 2015-10-13 MED ORDER — CIPROFLOXACIN HCL 500 MG PO TABS: 500.0000 mg | ORAL_TABLET | Freq: Two times a day (BID) | ORAL | 0 refills | Status: AC

## 2015-10-13 MED FILL — CIPROFLOXACIN HCL 500 MG TA: 500 | 7 days supply | Qty: 14 | Fill #0

## 2015-10-13 NOTE — ED Notes (Signed)
Pt called from lobby x 2 to treatment room with no answer.

## 2015-10-13 NOTE — ED Provider Notes (Signed)
[place] [facility] Provider Note   CSN: [id number] Arrival date & time: 10/13/15  0547     History   Chief Complaint Chief Complaint  Patient presents with  . Flank Pain    HPI Amanda Stafford is a 35 y.o. female with a history of pituitary tumor, hypothyroidism and nephrolithiasis. She is here with a one-week history of pain in the right flank which is intermittent. The pain is severe at times. It is worse with movement or palpation. She has had associated hematuria but no dysuria. She has had intermittent nausea but no vomiting. She denies fever or chills. She is not having any chest pain or shortness of breath.  She has also been having lightheadedness for several months which has worsened over the past week. It is exacerbated by standing up. She has had the sensation that she is going to pass out but has not had an actual episode of syncope. She has sometimes had palpitations but none presently.  She has also been having a sharp pain in her right mastoid region, worse with palpation or movement of the neck.  HPI  Past Medical History:  Diagnosis Date  . Diabetes mellitus without complication (HCC)    gestational diabetes.  . Generalized anxiety disorder   . Hyperthyroidism   . Hypothyroidism   . Kidney stones   . Panic disorder with agoraphobia   . Pituitary tumor ([place])   . PTSD (post-traumatic stress disorder)     There are no active problems to display for this patient.   Past Surgical History:  Procedure Laterality Date  . LITHOTRIPSY      OB History    No data available       Home Medications    Prior to Admission medications   Medication Sig Start Date End Date Taking? Authorizing Provider  ciprofloxacin (CIPRO) 500 MG tablet Take 1 tablet (500 mg total) by mouth 2 (two) times daily. One po bid x 7 days 10/13/15   Shanon Rosser, MD    Family History No family history on file.  Social History Social History  Substance Use Topics  . Smoking  status: Former Smoker    Quit date: 02/26/2011  . Smokeless tobacco: Not on file  . Alcohol use Yes     Comment: occassional      Allergies   Guiaphen hd [hc tussive] and Amoxicillin   Review of Systems Review of Systems  All other systems reviewed and are negative.   Physical Exam Updated Vital Signs BP 108/77 (BP Location: Right Arm)   Pulse 82   Temp 98 F (36.7 C) (Oral)   Resp 18   Ht 5\' 5"  (1.651 m)   Wt 148 lb (67.1 kg)   LMP 09/23/2015 Comment: (-) u preg//a.c.  SpO2 100%   BMI 24.63 kg/m   Physical Exam General: Well-developed, well-nourished female in no acute distress; appearance consistent with age of record HENT: normocephalic; atraumatic; TMs normal; no TMJ tenderness; tenderness over right mastoid without erythema or warmth Eyes: pupils equal, round and reactive to light; extraocular muscles intact Neck: supple Heart: regular rate and rhythm; no murmur Lungs: clear to auscultation bilaterally Abdomen: soft; nondistended; nontender; no masses or hepatosplenomegaly; bowel sounds present GU: Right CVA tenderness Extremities: No deformity; full range of motion; pulses normal Neurologic: Awake, alert and oriented; motor function intact in all extremities and symmetric; no facial droop Skin: Warm and dry Psychiatric: Normal mood and affect    ED Treatments / Results  Nursing notes and vitals signs, including pulse oximetry, reviewed.  Summary of this visit's results, reviewed by myself:   EKG Interpretation  Date/Time:  Friday October 13 2015 06:38:03 EDT Ventricular Rate:  64 PR Interval:    QRS Duration: 91 QT Interval:  407 QTC Calculation: 420 R Axis:   54 Text Interpretation:  Sinus rhythm Minimal ST depression, inferior leads No significant change was found Confirmed by Florina Ou  MD, Jenny Reichmann ([id number]) on 10/13/2015 6:41:37 AM       Labs:  Results for orders placed or performed during the hospital encounter of 10/13/15 (from the past 24 hour(s))   Pregnancy, urine     Status: None   Collection Time: 10/12/15  9:50 PM  Result Value Ref Range   Preg Test, Ur NEGATIVE NEGATIVE  Urinalysis, Routine w reflex microscopic (not at [facility])     Status: Abnormal   Collection Time: 10/12/15  9:50 PM  Result Value Ref Range   Color, Urine YELLOW YELLOW   APPearance CLEAR CLEAR   Specific Gravity, Urine 1.024 1.005 - 1.030   pH 5.5 5.0 - 8.0   Glucose, UA NEGATIVE NEGATIVE mg/dL   Hgb urine dipstick TRACE (A) NEGATIVE   Bilirubin Urine NEGATIVE NEGATIVE   Ketones, ur NEGATIVE NEGATIVE mg/dL   Protein, ur NEGATIVE NEGATIVE mg/dL   Nitrite NEGATIVE NEGATIVE   Leukocytes, UA SMALL (A) NEGATIVE  Urine microscopic-add on     Status: Abnormal   Collection Time: 10/12/15  9:50 PM  Result Value Ref Range   Squamous Epithelial / LPF 0-5 (A) NONE SEEN   WBC, UA 6-30 0 - 5 WBC/hpf   RBC / HPF 0-5 0 - 5 RBC/hpf   Bacteria, UA MANY (A) NONE SEEN   Casts HYALINE CASTS (A) NEGATIVE    Imaging Studies: Ct Renal Stone Study  Result Date: 10/13/2015 CLINICAL DATA:  35 year old with right flank pain.  Trace hematuria. EXAM: CT ABDOMEN AND PELVIS WITHOUT CONTRAST TECHNIQUE: Multidetector CT imaging of the abdomen and pelvis was performed following the standard protocol without IV contrast. COMPARISON:  05/15/2009 FINDINGS: Lower chest:  Lung bases are clear. Hepatobiliary: Normal appearance of the liver and gallbladder. Pancreas: Normal appearance of the pancreas without inflammation or duct dilatation. Spleen: Normal appearance of spleen without enlargement. Adrenals/Urinary Tract: Normal adrenal glands. Normal appearance of the left kidney without stones or hydronephrosis. Normal appearance of the urinary bladder. Normal appearance the right kidney without stones or hydronephrosis. Stomach/Bowel: Appendix is difficult to visualize but appears to present on sequence 2, image 60. No evidence for acute appendix inflammation. Moderate amount of stool throughout  the colon, particularly in the rectal region. There is no significant bowel dilatation and no evidence for bowel obstruction. Vascular/Lymphatic: No significant atherosclerotic disease in the aorta. No aortic aneurysm. No suspicious abdominal or pelvic lymphadenopathy. Reproductive: No gross abnormality to the uterus or adnexal structures but limited evaluation on this non contrast examination. There may be a trace amount of fluid along the right side of the pelvis which is likely physiologic. Other: Question a small amount of pelvic fluid.  No free air. Musculoskeletal: No acute bone abnormality. IMPRESSION: No CT findings to explain the right flank pain. No acute abnormality. Electronically Signed   By: Markus Daft M.D.   On: 10/13/2015 07:18   Patient advised to CT and lab findings. We'll treat for possible early pyelonephritis given patient's right flank pain and tenderness. Her orthostasis is likely related to her endocrine abnormalities. She has not yet  started taking medication for her hypothyroidism. She was advised to contact her endocrinologist for further evaluation.  Procedures (including critical care time)  Final Clinical Impressions(s) / ED Diagnoses   Final diagnoses:  Right flank pain  Orthostasis      Shanon Rosser, MD 10/13/15 0730

## 2015-10-13 NOTE — ED Triage Notes (Signed)
Pt c/o right flank pain for the last few days with hematuria.  She also c/o right ear pain for the last two weeks.  Pt also is concerned about intermittent dizziness and lightheadedness for the last several months.

## 2015-10-14 LAB — URINE CULTURE

## 2015-10-15 ENCOUNTER — Telehealth (HOSPITAL_BASED_OUTPATIENT_CLINIC_OR_DEPARTMENT_OTHER): Payer: Self-pay

## 2015-10-15 NOTE — Telephone Encounter (Signed)
Post ED Visit - Positive Culture Follow-up  Culture report reviewed by antimicrobial stewardship pharmacist:  []  Elenor Quinones, Pharm.D. []  Heide Guile, Pharm.D., BCPS []  Parks Neptune, Pharm.D. []  Alycia Rossetti, Pharm.D., BCPS []  Fellows, Pharm.D., BCPS, AAHIVP []  Legrand Como, Pharm.D., BCPS, AAHIVP []  Milus Glazier, Pharm.D. []  Stephens November, [place].D. Rachel Rumbarger Pharm D Positive urine culture Treated with Ciprofloxacin, organism sensitive to the same and no further patient follow-up is required at this time.  Genia Del 10/15/2015, 11:26 AM

## 2015-10-16 ENCOUNTER — Telehealth (HOSPITAL_BASED_OUTPATIENT_CLINIC_OR_DEPARTMENT_OTHER): Payer: Self-pay | Admitting: *Deleted

## 2015-10-16 NOTE — Telephone Encounter (Signed)
Pt called requesting antibiotic change from her ED visit on 10/13/2015 because the Cipro was making her feel anxious and she "can't tolerate it". Chart reviewed by Dr. Venora Maples. Per his VORB pt advised to stop Cipro and no additional antibiotic needed at this time based on urine culture results. Pt informed of Dr. Addison Naegeli recommendations and advised to f/u up with PCP and to return to ED if needed.

## 2016-01-06 ENCOUNTER — Emergency Department (HOSPITAL_BASED_OUTPATIENT_CLINIC_OR_DEPARTMENT_OTHER)
Admission: EM | Admit: 2016-01-06 | Discharge: 2016-01-06 | Disposition: A | Discharge status: Home / Self Care | Payer: Medicaid Other | Attending: Emergency Medicine | Admitting: Emergency Medicine

## 2016-01-06 ENCOUNTER — Encounter (HOSPITAL_BASED_OUTPATIENT_CLINIC_OR_DEPARTMENT_OTHER): Payer: Self-pay

## 2016-01-06 DIAGNOSIS — Z87891 Personal history of nicotine dependence: Secondary | ICD-10-CM | POA: Diagnosis not present

## 2016-01-06 DIAGNOSIS — E039 Hypothyroidism, unspecified: Secondary | ICD-10-CM | POA: Insufficient documentation

## 2016-01-06 DIAGNOSIS — H1032 Unspecified acute conjunctivitis, left eye: Secondary | ICD-10-CM | POA: Insufficient documentation

## 2016-01-06 DIAGNOSIS — H578 Other specified disorders of eye and adnexa: Secondary | ICD-10-CM | POA: Diagnosis present

## 2016-01-06 DIAGNOSIS — E119 Type 2 diabetes mellitus without complications: Secondary | ICD-10-CM | POA: Diagnosis not present

## 2016-01-06 MED ORDER — CIPROFLOXACIN HCL 0.3 % OP SOLN: 2.0000 [drp] | OPHTHALMIC | 0 refills | Status: AC

## 2016-01-06 MED ORDER — TETRACAINE HCL 0.5 % OP SOLN
2.0000 [drp] | Freq: Once | OPHTHALMIC | Status: AC
  Administered 2016-01-06: 2 [drp] via OPHTHALMIC
  Filled 2016-01-06: qty 4

## 2016-01-06 MED ORDER — FLUORESCEIN SODIUM 1 MG OP STRP
ORAL_STRIP | OPHTHALMIC | Status: AC
  Administered 2016-01-06: 1
  Filled 2016-01-06: qty 1

## 2016-01-06 NOTE — ED Provider Notes (Signed)
[place] [facility] Provider Note   CSN: [id number] Arrival date & time: 01/06/16  [id number]     History   Chief Complaint Chief Complaint  Patient presents with  . Eye Pain    HPI Amanda Stafford is a 35 y.o. female.  The history is provided by the patient.  Eye Pain  This is a new problem. The current episode started 1 to 2 hours ago. The problem occurs constantly. The problem has not changed since onset.Pertinent negatives include no chest pain, no headaches and no shortness of breath. Nothing aggravates the symptoms. Nothing relieves the symptoms. She has tried nothing for the symptoms. The treatment provided no relief.  Eye Problem   This is a new problem. The current episode started 1 to 2 hours ago. The problem occurs constantly. The problem has not changed since onset.There is a problem in the left eye. There was no injury mechanism. The pain is moderate. There is no history of trauma to the eye. There is no known exposure to pink eye. She does not wear contacts. Associated symptoms include discharge and eye redness. Pertinent negatives include no blurred vision, no decreased vision and no photophobia. She has tried nothing for the symptoms. The treatment provided no relief.    Past Medical History:  Diagnosis Date  . Diabetes mellitus without complication (HCC)    gestational diabetes.  . Generalized anxiety disorder   . Hyperthyroidism   . Hypothyroidism   . Kidney stones   . Panic disorder with agoraphobia   . Pituitary tumor   . PTSD (post-traumatic stress disorder)     There are no active problems to display for this patient.   Past Surgical History:  Procedure Laterality Date  . LITHOTRIPSY      OB History    No data available       Home Medications    Prior to Admission medications   Medication Sig Start Date End Date Taking? Authorizing Provider  ciprofloxacin (CILOXAN) 0.3 % ophthalmic solution Place 2 drops into the left eye every 2 (two)  hours. Administer 1 drop, every 2 hours, while awake, for 2 days. Then 1 drop, every 4 hours, while awake, for the next 5 days. 01/06/16   Trevious Rampey, MD  ciprofloxacin (CIPRO) 500 MG tablet Take 1 tablet (500 mg total) by mouth 2 (two) times daily. One po bid x 7 days 10/13/15   Shanon Rosser, MD    Family History No family history on file.  Social History Social History  Substance Use Topics  . Smoking status: Former Smoker    Quit date: 02/26/2011  . Smokeless tobacco: Never Used  . Alcohol use Yes     Comment: occassional      Allergies   Guiaphen hd [hc tussive] and Amoxicillin   Review of Systems Review of Systems  Constitutional: Negative for fever.  Eyes: Positive for pain, discharge and redness. Negative for blurred vision, photophobia and visual disturbance.  Respiratory: Negative for shortness of breath.   Cardiovascular: Negative for chest pain.  Neurological: Negative for headaches.  All other systems reviewed and are negative.    Physical Exam Updated Vital Signs BP 111/81 (BP Location: Right Arm)   Pulse 79   Temp 98.1 F (36.7 C) (Oral)   Resp 18   Ht 5\' 5"  (1.651 m)   Wt 146 lb (66.2 kg)   LMP 12/15/2015   SpO2 100%   BMI 24.30 kg/m   Physical Exam  Constitutional: She is oriented  to person, place, and time. She appears well-developed and well-nourished. No distress.  HENT:  Head: Normocephalic and atraumatic.  Eyes: EOM and lids are normal. Pupils are equal, round, and reactive to light. Lids are everted and swept, no foreign bodies found. Left eye exhibits discharge. Left conjunctiva is injected. Left eye exhibits normal extraocular motion and no nystagmus.  Fundoscopic exam:      The left eye shows no hemorrhage.  Slit lamp exam:      The left eye shows no corneal abrasion, no corneal ulcer and no foreign body.  Neck: Normal range of motion. Neck supple.  Cardiovascular: Normal rate, regular rhythm and intact distal pulses.     Pulmonary/Chest: Effort normal and breath sounds normal. No respiratory distress. She has no wheezes. She has no rales.  Abdominal: Soft. Bowel sounds are normal.  Musculoskeletal: Normal range of motion.  Neurological: She is alert and oriented to person, place, and time.  Skin: Skin is warm and dry. Capillary refill takes less than 2 seconds.  Psychiatric: She has a normal mood and affect.     ED Treatments / Results   Vitals:   01/06/16 0637  BP: 111/81  Pulse: 79  Resp: 18  Temp: 98.1 F (36.7 C)    Procedures Procedures (including critical care time)  Medications Ordered in ED Medications  tetracaine (PONTOCAINE) 0.5 % ophthalmic solution 2 drop (2 drops Left Eye Given 01/06/16 0646)  fluorescein 1 MG ophthalmic strip (1 strip  Given 01/06/16 0646)     Final Clinical Impressions(s) / ED Diagnoses   Final diagnoses:  Acute conjunctivitis of left eye, unspecified acute conjunctivitis type   She is safe for discharge. All questions answered to patient's satisfaction. Based on history and exam patient has been appropriately medically screened and emergency conditions excluded. Patient is stable for discharge at this time. Follow up with your PMD for recheck in 2 days and strict return precautions given.  New Prescriptions New Prescriptions   CIPROFLOXACIN (CILOXAN) 0.3 % OPHTHALMIC SOLUTION    Place 2 drops into the left eye every 2 (two) hours. Administer 1 drop, every 2 hours, while awake, for 2 days. Then 1 drop, every 4 hours, while awake, for the next 5 days.     Veatrice Kells, MD 01/06/16 [phone]

## 2016-01-06 NOTE — ED Notes (Signed)
Woke with pain to left eye  Denies inj

## 2016-01-06 NOTE — ED Triage Notes (Signed)
Pt c/o lt eye pressure with redness and drainage since 0230, worse when closed

## 2018-02-11 IMAGING — CT CT RENAL STONE PROTOCOL
2 of 4 series · 16 of 46 positions shown, 18 images · non-contrast
Comparison: 05/15/2009

CLINICAL DATA: 34-year-old with right flank pain.  Trace hematuria.

EXAM:
CT ABDOMEN AND PELVIS WITHOUT CONTRAST
TECHNIQUE: Multidetector CT imaging of the abdomen and pelvis was performed
following the standard protocol without IV contrast.

[Series 2: axial st · axial · 0.87mm/px · z∈[-496,-71]mm · 13 of 93 slices shown, 15 images]
[im 4/93  soft-tissue]
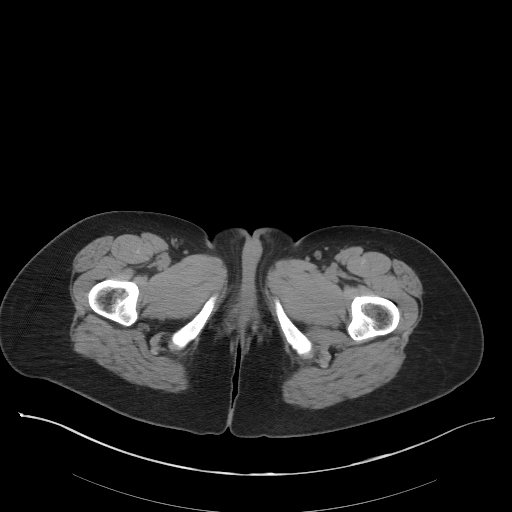
[im 4/93  bone]
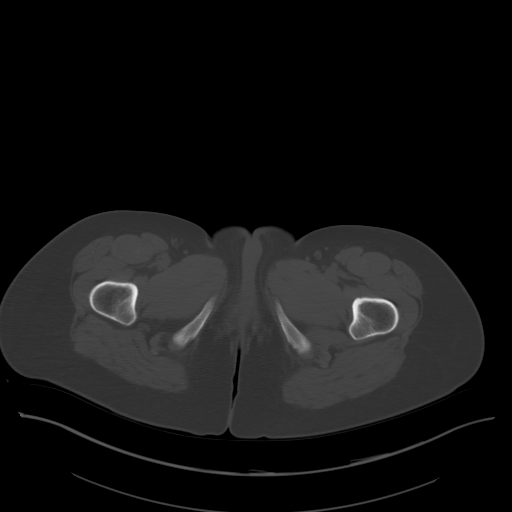
[im 12/93  soft-tissue]
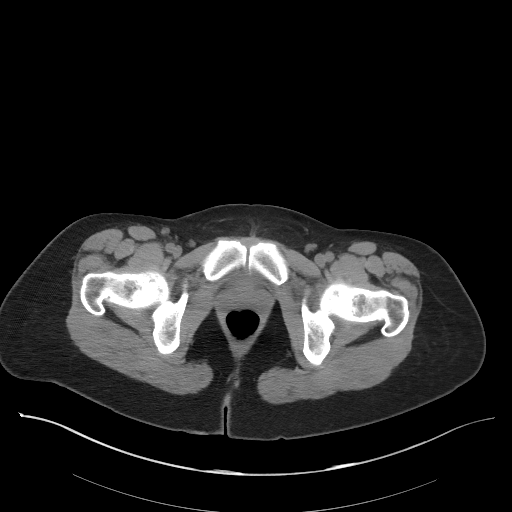
[im 20/93  soft-tissue]
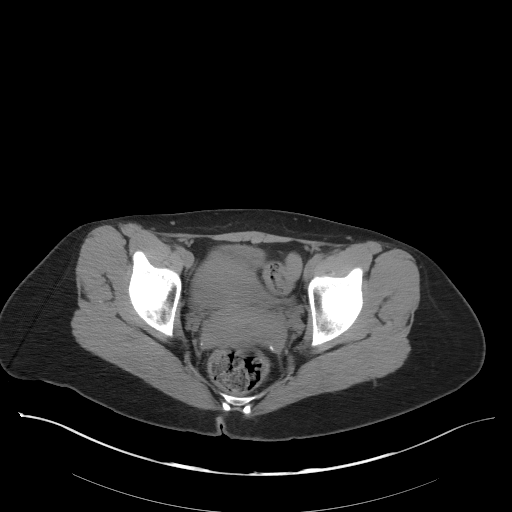
[im 27/93  soft-tissue]
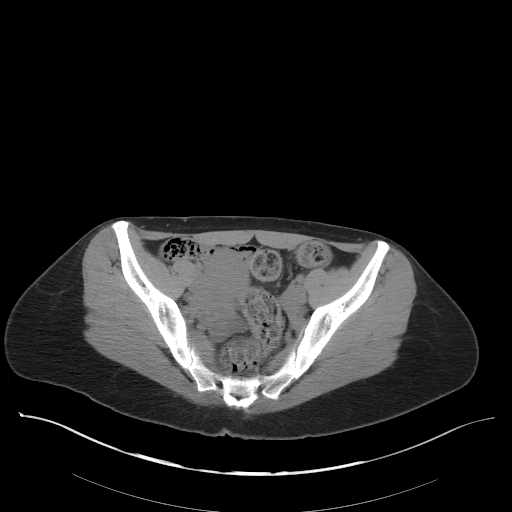
[im 31/93  soft-tissue]
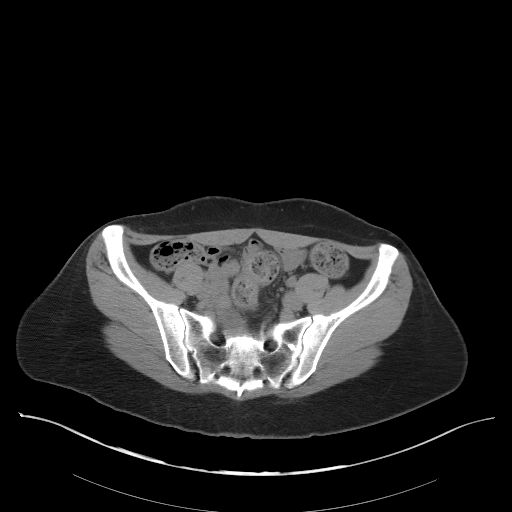
[im 39/93  soft-tissue]
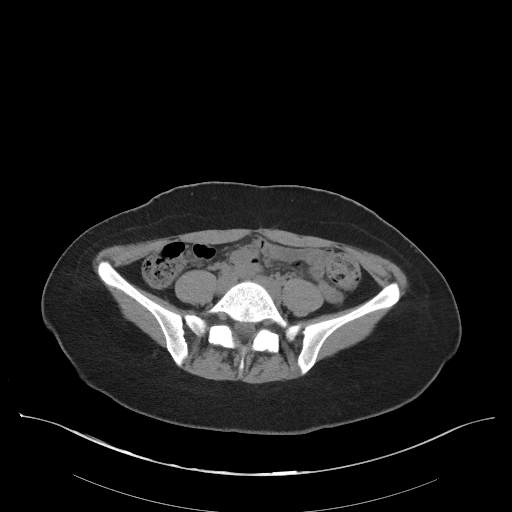
[im 47/93  soft-tissue]
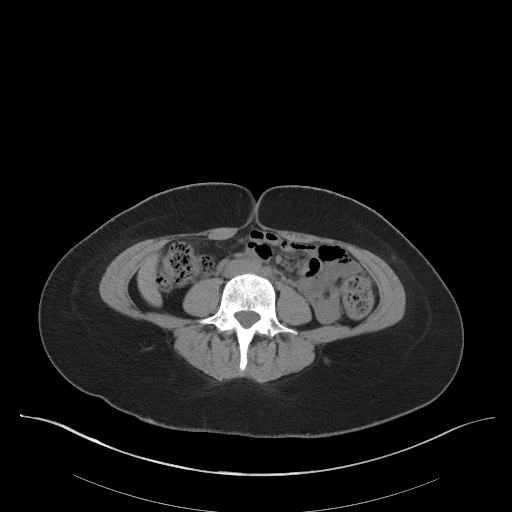
[im 54/93  soft-tissue]
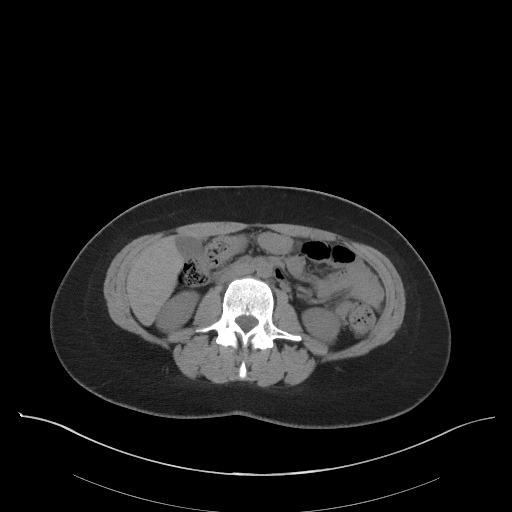
[im 62/93  soft-tissue]
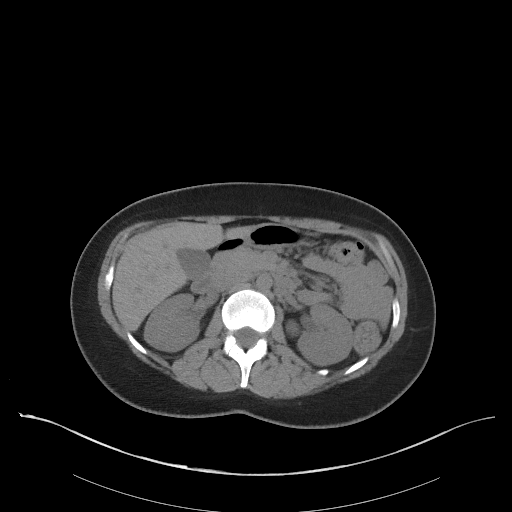
[im 62/93  bone]
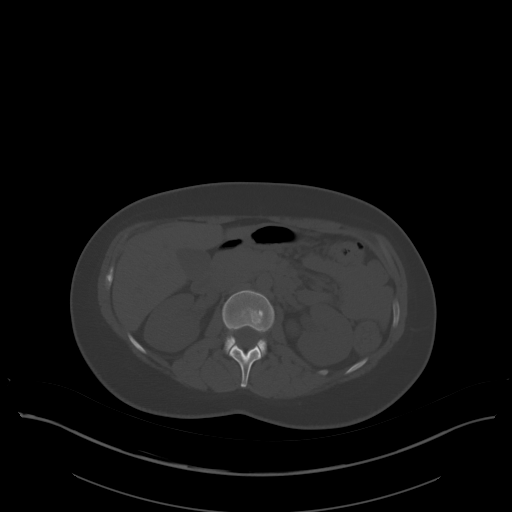
[im 66/93  soft-tissue]
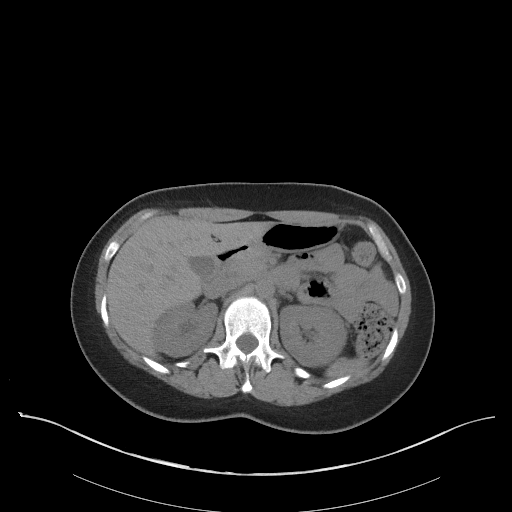
[im 73/93  soft-tissue]
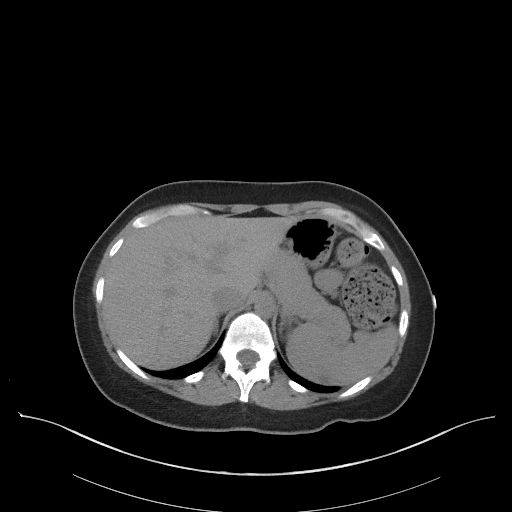
[im 81/93  soft-tissue]
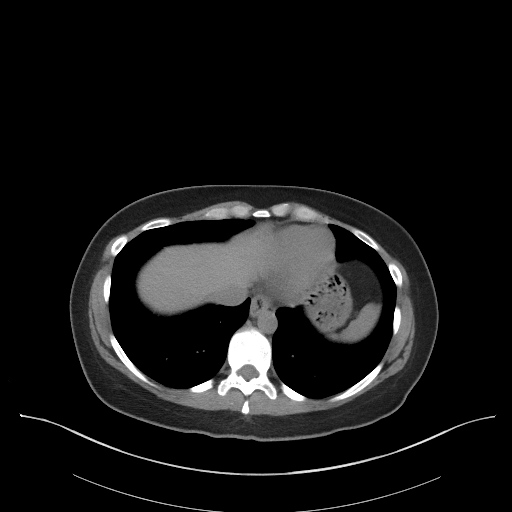
[im 89/93  soft-tissue]
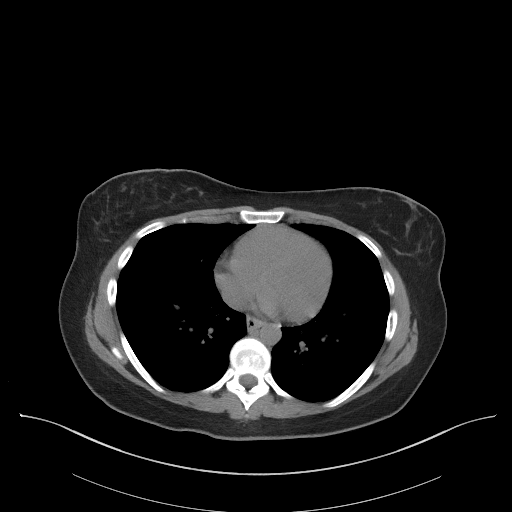

[Series 5: coronal st · coronal · 0.67mm/px · 3 of 67 slices shown]
[im 23/67  soft-tissue]
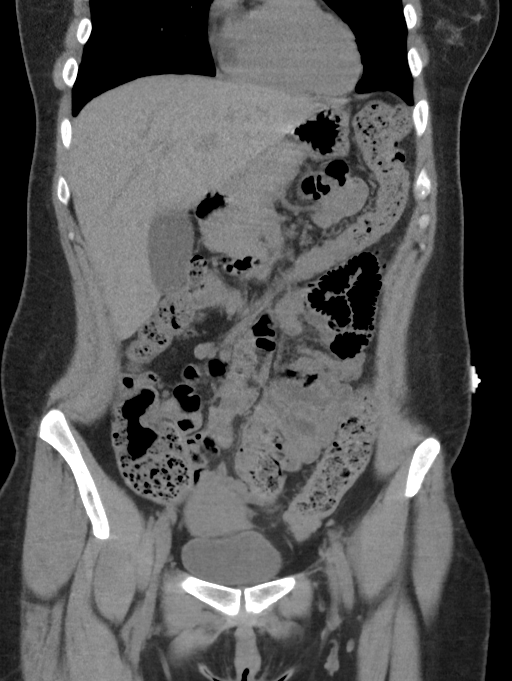
[im 30/67  soft-tissue]
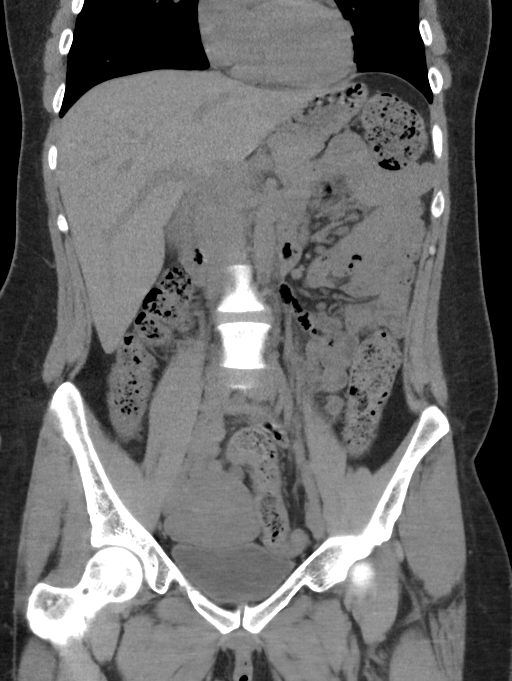
[im 37/67  soft-tissue]
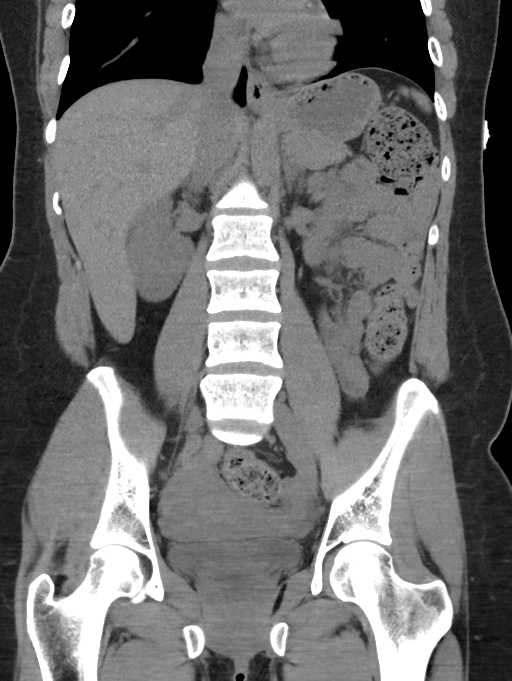

[16 of 46 positions shown; findings below may reference images not displayed]

FINDINGS: Lower chest:  Lung bases are clear.

Hepatobiliary: Normal appearance of the liver and gallbladder.

Pancreas: Normal appearance of the pancreas without inflammation or
duct dilatation.

Spleen: Normal appearance of spleen without enlargement.

Adrenals/Urinary Tract: Normal adrenal glands. Normal appearance of
the left kidney without stones or hydronephrosis. Normal appearance
of the urinary bladder. Normal appearance the right kidney without
stones or hydronephrosis.

Stomach/Bowel: Appendix is difficult to visualize but appears to
present on sequence 2, image 60. No evidence for acute appendix
inflammation. Moderate amount of stool throughout the colon,
particularly in the rectal region. There is no significant bowel
dilatation and no evidence for bowel obstruction.

Vascular/Lymphatic: No significant atherosclerotic disease in the
aorta. No aortic aneurysm. No suspicious abdominal or pelvic
lymphadenopathy.

Reproductive: No gross abnormality to the uterus or adnexal
structures but limited evaluation on this non contrast examination.
There may be a trace amount of fluid along the right side of the
pelvis which is likely physiologic.

Other: Question a small amount of pelvic fluid.  No free air.

Musculoskeletal: No acute bone abnormality.
IMPRESSION: No CT findings to explain the right flank pain. No acute
abnormality.
